# Patient Record
Sex: Female | Born: 1980 | Race: White | Hispanic: No | Marital: Married | Smoking: Former smoker
Health system: Southern US, Community
[De-identification: ages and names within clinical notes are randomized; demographics above are authoritative.]

## PROBLEM LIST (undated history)

## (undated) DIAGNOSIS — D509 Iron deficiency anemia, unspecified: Secondary | ICD-10-CM

## (undated) HISTORY — PX: TONSILLECTOMY: SUR1361

## (undated) HISTORY — DX: Iron deficiency anemia, unspecified: D50.9

## (undated) HISTORY — PX: DILATION AND CURETTAGE OF UTERUS: SHX78

---

## 2001-01-21 HISTORY — PX: LEEP: SHX91

## 2008-04-11 ENCOUNTER — Ambulatory Visit: Payer: Self-pay | Admitting: Interventional Radiology

## 2008-04-11 ENCOUNTER — Ambulatory Visit (HOSPITAL_BASED_OUTPATIENT_CLINIC_OR_DEPARTMENT_OTHER): Admission: RE | Admit: 2008-04-11 | Discharge: 2008-04-11 | Payer: Self-pay | Admitting: Family Medicine

## 2009-10-11 IMAGING — CT CT PELVIS WO/W CM
2 of 8 series · 13 of 46 positions shown, 18 images · IV contrast (agent unspecified)
Comparison: None

CT ABDOMEN

Addendum Begins

CT Pelvis IMPRESSION:
1. 2.8 cm left ovarian cyst.
2. Normal appendix.
3. Otherwise unremarkable CT pelvis.
Addendum Ends
CLINICAL DATA: Left flank pain, nausea
CT ABDOMEN AND PELVIS WITHOUT AND WITH CONTRAST
TECHNIQUE: Multidetector CT imaging of the abdomen and pelvis was
performed following the standard protocol before and following the
bolus administration of intravenous contrast.
Contrast:  100 ml [id number]-F99 IV

[Series 3: abd/pelvis w/o 3.0 coronal · coronal · non-contrast · 0.66mm/px · 3 of 69 slices shown]
[im 18/69  soft-tissue]
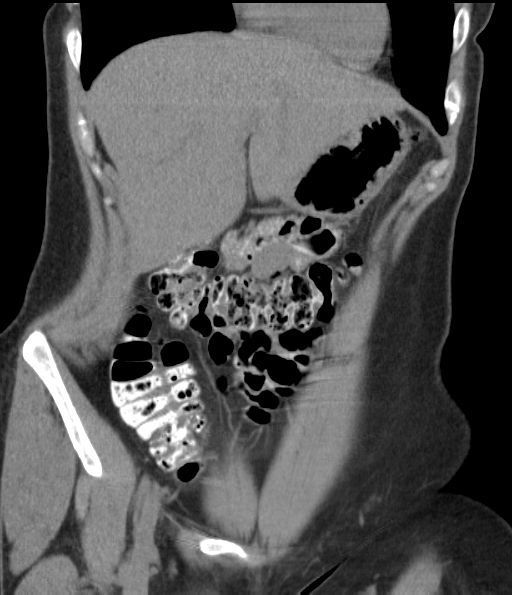
[im 35/69  soft-tissue]
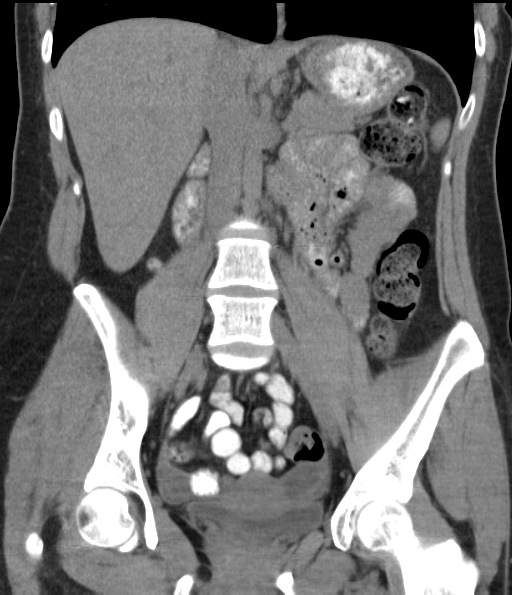
[im 52/69  soft-tissue]
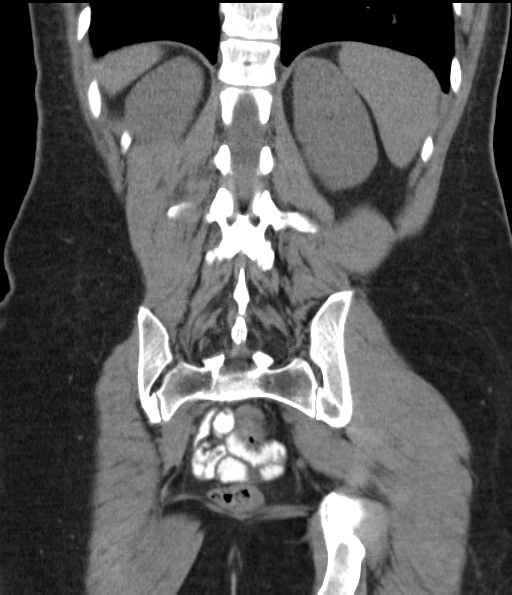

[Series 6: abd/pelvis w/ 5.0 b31f · axial · 0.70mm/px · z∈[-511,-161]mm · 10 of 85 slices shown, 15 images]
[im 8/85  soft-tissue]
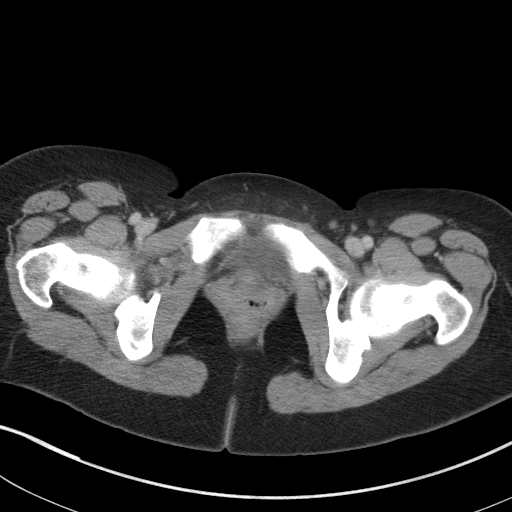
[im 8/85  bone]
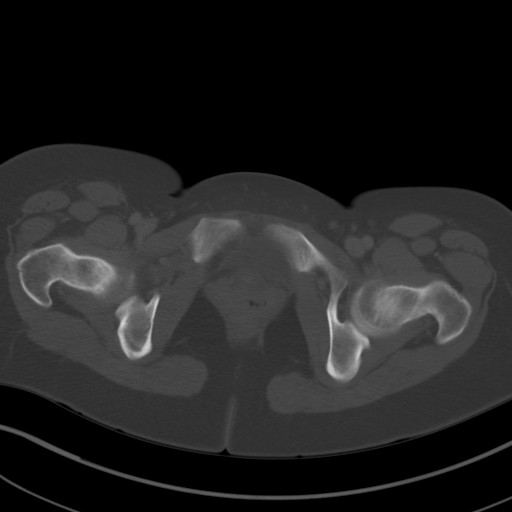
[im 15/85  soft-tissue]
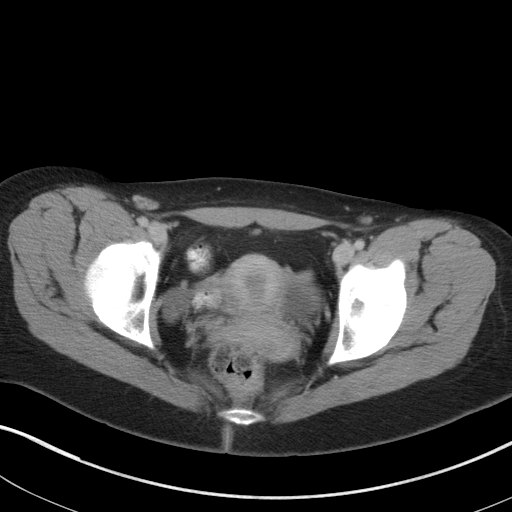
[im 29/85  soft-tissue]
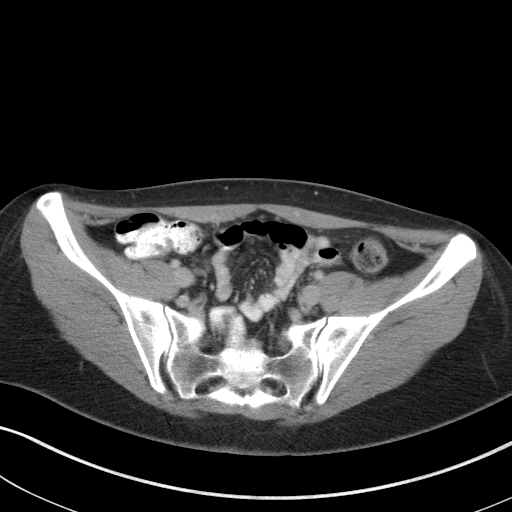
[im 36/85  soft-tissue]
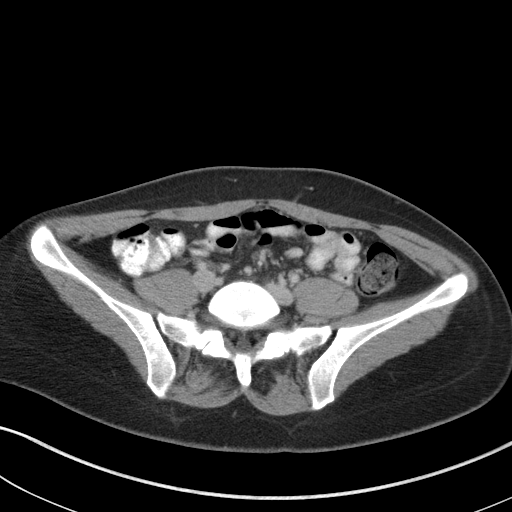
[im 43/85  soft-tissue]
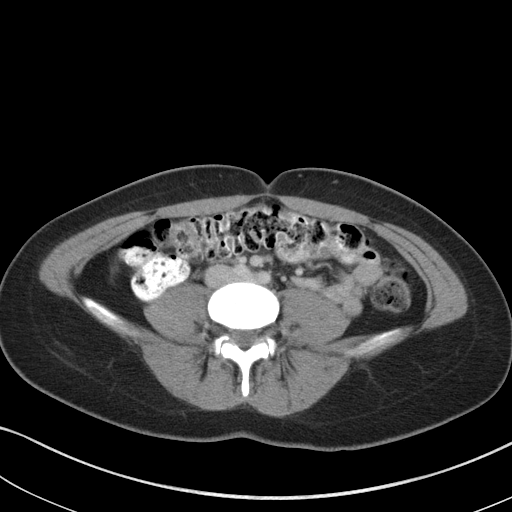
[im 50/85  soft-tissue]
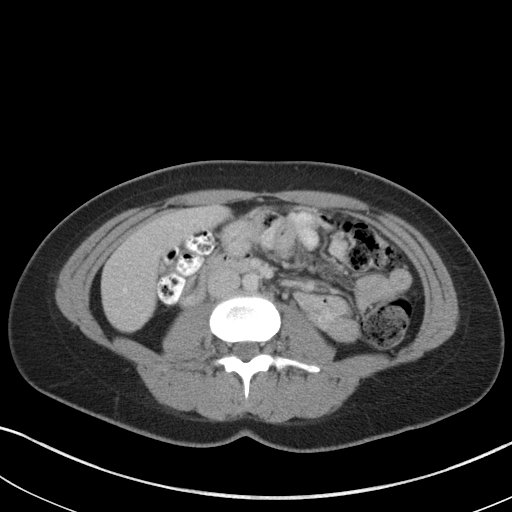
[im 57/85  soft-tissue]
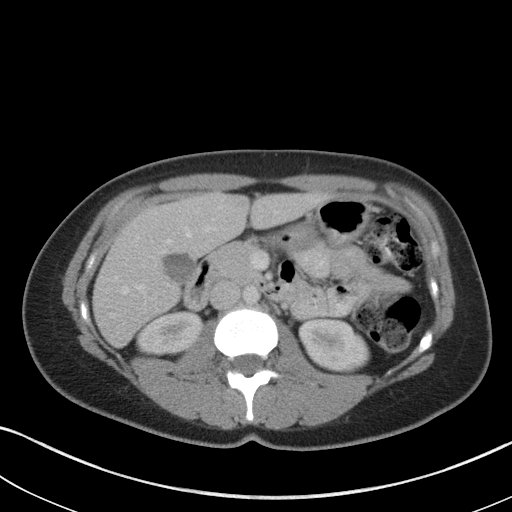
[im 57/85  lung]
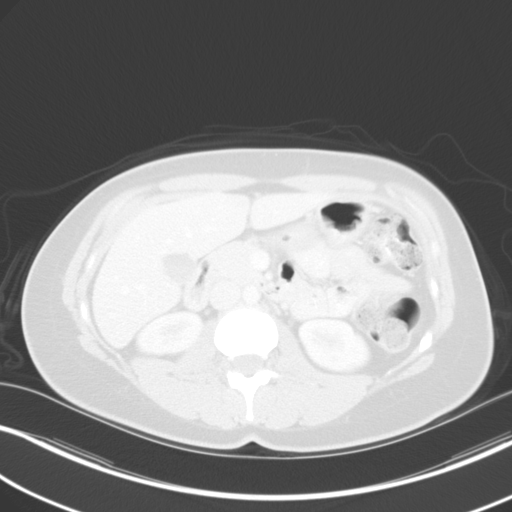
[im 64/85  lung]
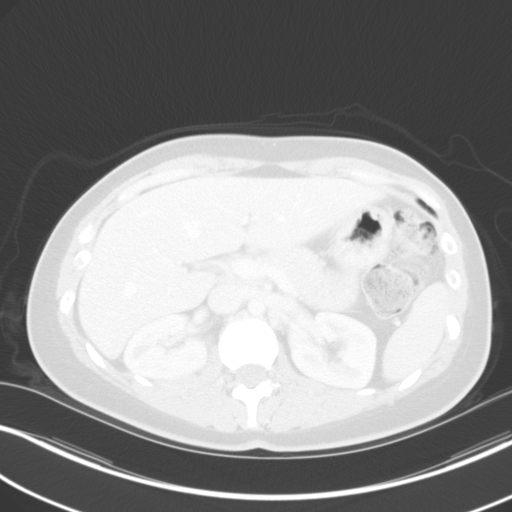
[im 71/85  soft-tissue]
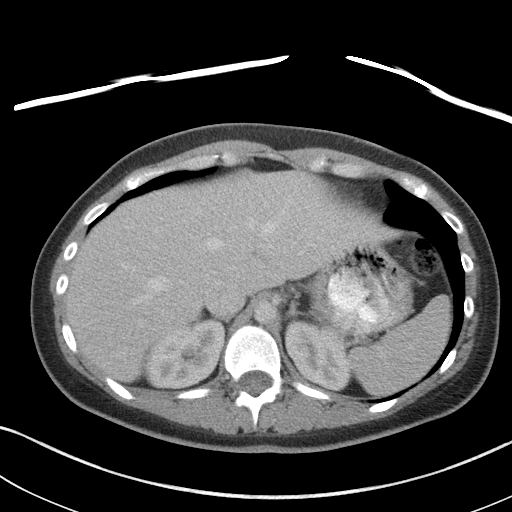
[im 71/85  lung]
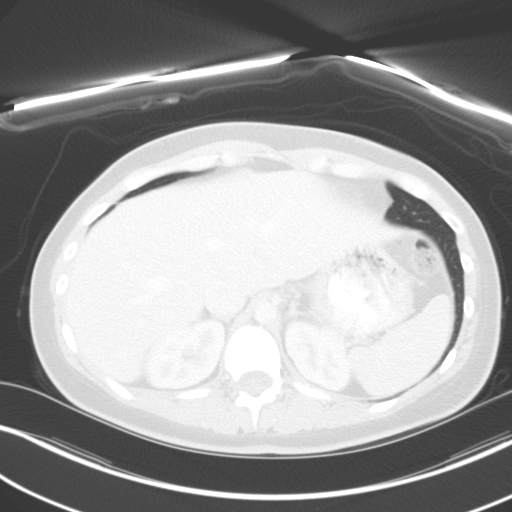
[im 78/85  soft-tissue]
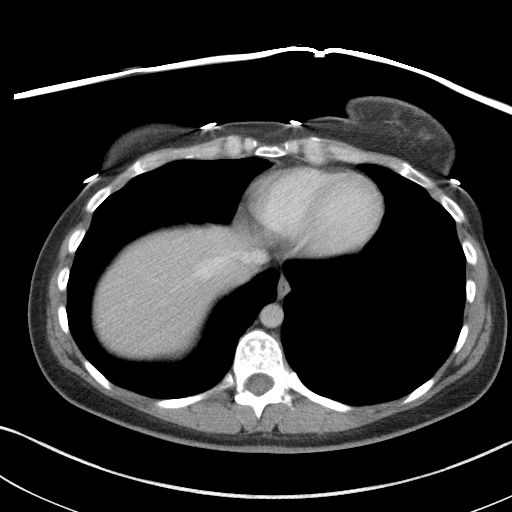
[im 78/85  lung]
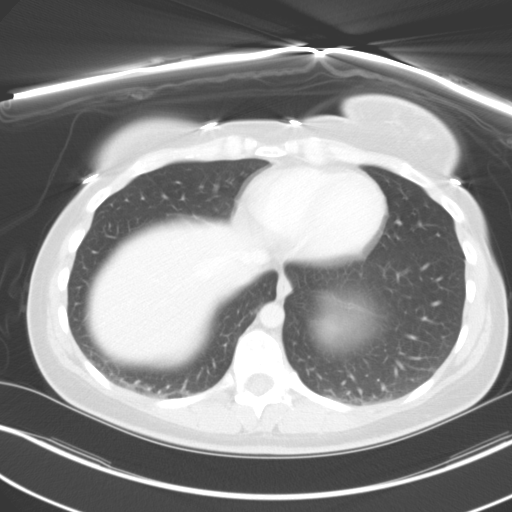
[im 78/85  bone]
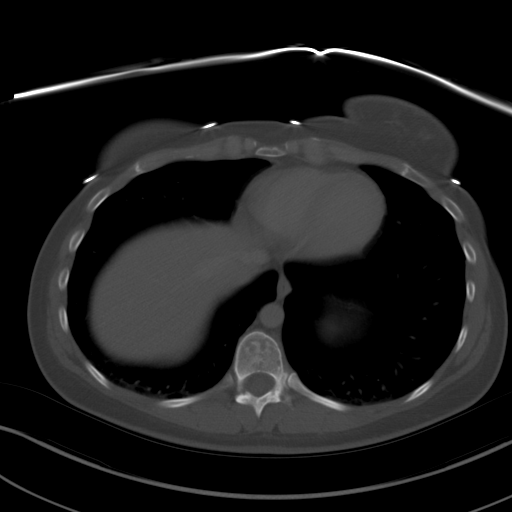

[13 of 46 positions shown; findings below may reference images not displayed]

FINDINGS: Minimal dependent atelectasis posteriorly in the
visualized lung bases.  The noncontrast scan shows no
nephrolithiasis or proximal ureteral calculus.  After IV contrast
administration, unremarkable appearance of the liver, gallbladder,
spleen, adrenal glands, kidneys, pancreas, abdominal aorta, small
bowel.  No free air.  No ascites.  No hydronephrosis.  No
mesenteric or retroperitoneal adenopathy.  Portal vein patent.
There is symmetric enhancement of  the renal parenchyma.
IMPRESSION: 1.  No acute abdominal abnormality.

CT PELVIS
FINDINGS: Normal appendix.  The colon is nondilated, unremarkable.
Urinary bladder incompletely distended. 2.8 cm left ovarian cyst.
Uterus and   right adnexal region unremarkable.  No free fluid.
Normal vascular enhancement.  No adenopathy.
IMPRESSION: 1.  2.8 cm left renal ovarian cyst.
2.  Normal appendix.
3.  Otherwise unremarkable CT pelvis.

## 2010-02-11 ENCOUNTER — Encounter: Payer: Self-pay | Admitting: Family Medicine

## 2013-04-08 LAB — OB RESULTS CONSOLE HIV ANTIBODY (ROUTINE TESTING): HIV: NONREACTIVE

## 2013-04-08 LAB — OB RESULTS CONSOLE RPR: RPR: NONREACTIVE

## 2013-04-08 LAB — OB RESULTS CONSOLE ABO/RH: RH Type: POSITIVE

## 2013-04-08 LAB — OB RESULTS CONSOLE HEPATITIS B SURFACE ANTIGEN: Hepatitis B Surface Ag: NEGATIVE

## 2013-04-08 LAB — OB RESULTS CONSOLE GC/CHLAMYDIA
CHLAMYDIA, DNA PROBE: NEGATIVE
GC PROBE AMP, GENITAL: NEGATIVE

## 2013-04-08 LAB — OB RESULTS CONSOLE RUBELLA ANTIBODY, IGM: Rubella: IMMUNE

## 2013-10-14 ENCOUNTER — Inpatient Hospital Stay (HOSPITAL_COMMUNITY): Payer: 59 | Admitting: Anesthesiology

## 2013-10-14 ENCOUNTER — Inpatient Hospital Stay (HOSPITAL_COMMUNITY)
Admission: AD | Admit: 2013-10-14 | Discharge: 2013-10-17 | DRG: 765 | Disposition: A | Discharge status: Home / Self Care | Payer: 59 | Source: Ambulatory Visit | Attending: Obstetrics and Gynecology | Admitting: Obstetrics and Gynecology

## 2013-10-14 ENCOUNTER — Encounter (HOSPITAL_COMMUNITY): Payer: Self-pay | Admitting: General Practice

## 2013-10-14 ENCOUNTER — Encounter (HOSPITAL_COMMUNITY): Payer: 59 | Admitting: Anesthesiology

## 2013-10-14 ENCOUNTER — Encounter (HOSPITAL_COMMUNITY)
Admission: AD | Disposition: A | Discharge status: Home / Self Care | Payer: Self-pay | Source: Ambulatory Visit | Attending: Obstetrics and Gynecology

## 2013-10-14 DIAGNOSIS — O139 Gestational [pregnancy-induced] hypertension without significant proteinuria, unspecified trimester: Secondary | ICD-10-CM | POA: Diagnosis present

## 2013-10-14 DIAGNOSIS — Z98891 History of uterine scar from previous surgery: Secondary | ICD-10-CM

## 2013-10-14 DIAGNOSIS — O1414 Severe pre-eclampsia complicating childbirth: Secondary | ICD-10-CM | POA: Diagnosis present

## 2013-10-14 DIAGNOSIS — Z87891 Personal history of nicotine dependence: Secondary | ICD-10-CM

## 2013-10-14 DIAGNOSIS — O4590 Premature separation of placenta, unspecified, unspecified trimester: Secondary | ICD-10-CM | POA: Diagnosis present

## 2013-10-14 DIAGNOSIS — O368390 Maternal care for abnormalities of the fetal heart rate or rhythm, unspecified trimester, not applicable or unspecified: Secondary | ICD-10-CM | POA: Diagnosis present

## 2013-10-14 DIAGNOSIS — O1423 HELLP syndrome (HELLP), third trimester: Secondary | ICD-10-CM

## 2013-10-14 DIAGNOSIS — O1420 HELLP syndrome (HELLP), unspecified trimester: Secondary | ICD-10-CM | POA: Diagnosis present

## 2013-10-14 LAB — CBC
HCT: 40.3 % (ref 36.0–46.0)
HEMOGLOBIN: 13.8 g/dL (ref 12.0–15.0)
MCH: 32 pg (ref 26.0–34.0)
MCHC: 34.2 g/dL (ref 30.0–36.0)
MCV: 93.5 fL (ref 78.0–100.0)
Platelets: 139 10*3/uL — ABNORMAL LOW (ref 150–400)
RBC: 4.31 MIL/uL (ref 3.87–5.11)
RDW: 13.2 % (ref 11.5–15.5)
WBC: 11.9 10*3/uL — ABNORMAL HIGH (ref 4.0–10.5)

## 2013-10-14 LAB — COMPREHENSIVE METABOLIC PANEL
ALBUMIN: 2.7 g/dL — AB (ref 3.5–5.2)
ALK PHOS: 209 U/L — AB (ref 39–117)
ALT: 147 U/L — AB (ref 0–35)
AST: 119 U/L — ABNORMAL HIGH (ref 0–37)
Anion gap: 17 — ABNORMAL HIGH (ref 5–15)
BUN: 10 mg/dL (ref 6–23)
CO2: 21 mEq/L (ref 19–32)
Calcium: 9.2 mg/dL (ref 8.4–10.5)
Chloride: 100 mEq/L (ref 96–112)
Creatinine, Ser: 0.7 mg/dL (ref 0.50–1.10)
GFR calc Af Amer: >90 mL/min (ref >90)
GFR calc non Af Amer: >90 mL/min (ref >90)
Glucose, Bld: 75 mg/dL (ref 70–99)
POTASSIUM: 4.4 meq/L (ref 3.7–5.3)
SODIUM: 138 meq/L (ref 137–147)
TOTAL PROTEIN: 6.7 g/dL (ref 6.0–8.3)
Total Bilirubin: 0.3 mg/dL (ref 0.3–1.2)

## 2013-10-14 LAB — TYPE AND SCREEN
ABO/RH(D): AB POS
Antibody Screen: NEGATIVE

## 2013-10-14 LAB — ABO/RH: ABO/RH(D): AB POS

## 2013-10-14 SURGERY — Surgical Case
Anesthesia: Spinal

## 2013-10-14 MED ORDER — MORPHINE SULFATE 0.5 MG/ML IJ SOLN
INTRAMUSCULAR | Status: AC
  Filled 2013-10-14: qty 10

## 2013-10-14 MED ORDER — DIPHENHYDRAMINE HCL 25 MG PO CAPS: 25.0000 mg | ORAL_CAPSULE | ORAL | Status: DC | PRN

## 2013-10-14 MED ORDER — SCOPOLAMINE 1 MG/3DAYS TD PT72
1.0000 | MEDICATED_PATCH | Freq: Once | TRANSDERMAL | Status: DC
Start: 2013-10-14 — End: 2013-10-17
  Administered 2013-10-14: 1.5 mg via TRANSDERMAL

## 2013-10-14 MED ORDER — PHENYLEPHRINE 8 MG IN D5W 100 ML (0.08MG/ML) PREMIX OPTIME
INJECTION | INTRAVENOUS | Status: AC
  Filled 2013-10-14: qty 100

## 2013-10-14 MED ORDER — FENTANYL CITRATE 0.05 MG/ML IJ SOLN
INTRAMUSCULAR | Status: AC
  Filled 2013-10-14: qty 2

## 2013-10-14 MED ORDER — NALOXONE HCL 1 MG/ML IJ SOLN
1.0000 ug/kg/h | INTRAVENOUS | Status: DC | PRN
  Filled 2013-10-14: qty 2

## 2013-10-14 MED ORDER — OXYTOCIN 40 UNITS IN LACTATED RINGERS INFUSION - SIMPLE MED
INTRAVENOUS | Status: DC | PRN
  Administered 2013-10-14: 40 [IU] via INTRAVENOUS

## 2013-10-14 MED ORDER — CITRIC ACID-SODIUM CITRATE 334-500 MG/5ML PO SOLN
30.0000 mL | Freq: Once | ORAL | Status: AC
  Administered 2013-10-14: 30 mL via ORAL

## 2013-10-14 MED ORDER — NALOXONE HCL 0.4 MG/ML IJ SOLN
0.4000 mg | INTRAMUSCULAR | Status: DC | PRN
Start: 2013-10-14 — End: 2013-10-17

## 2013-10-14 MED ORDER — MAGNESIUM SULFATE 40 G IN LACTATED RINGERS - SIMPLE
2.0000 g/h | INTRAVENOUS | Status: DC
  Administered 2013-10-14: 2 g/h via INTRAVENOUS
  Filled 2013-10-14: qty 500

## 2013-10-14 MED ORDER — WITCH HAZEL-GLYCERIN EX PADS: 1.0000 "application " | MEDICATED_PAD | CUTANEOUS | Status: DC | PRN

## 2013-10-14 MED ORDER — HYDROMORPHONE HCL 1 MG/ML IJ SOLN: 0.2500 mg | INTRAMUSCULAR | Status: DC | PRN

## 2013-10-14 MED ORDER — DIPHENHYDRAMINE HCL 50 MG/ML IJ SOLN: 12.5000 mg | INTRAMUSCULAR | Status: DC | PRN

## 2013-10-14 MED ORDER — LANOLIN HYDROUS EX OINT: 1.0000 "application " | TOPICAL_OINTMENT | CUTANEOUS | Status: DC | PRN

## 2013-10-14 MED ORDER — MEPERIDINE HCL 25 MG/ML IJ SOLN: 6.2500 mg | INTRAMUSCULAR | Status: DC | PRN

## 2013-10-14 MED ORDER — ZOLPIDEM TARTRATE 5 MG PO TABS: 5.0000 mg | ORAL_TABLET | Freq: Every evening | ORAL | Status: DC | PRN

## 2013-10-14 MED ORDER — DIPHENHYDRAMINE HCL 25 MG PO CAPS: 25.0000 mg | ORAL_CAPSULE | Freq: Four times a day (QID) | ORAL | Status: DC | PRN

## 2013-10-14 MED ORDER — CITRIC ACID-SODIUM CITRATE 334-500 MG/5ML PO SOLN
ORAL | Status: AC
  Filled 2013-10-14: qty 15

## 2013-10-14 MED ORDER — SIMETHICONE 80 MG PO CHEW
80.0000 mg | CHEWABLE_TABLET | Freq: Three times a day (TID) | ORAL | Status: DC
  Administered 2013-10-15 – 2013-10-17 (×6): 80 mg via ORAL
  Filled 2013-10-14 (×6): qty 1

## 2013-10-14 MED ORDER — NALBUPHINE HCL 10 MG/ML IJ SOLN: 5.0000 mg | INTRAMUSCULAR | Status: DC | PRN

## 2013-10-14 MED ORDER — SCOPOLAMINE 1 MG/3DAYS TD PT72
MEDICATED_PATCH | TRANSDERMAL | Status: AC
  Filled 2013-10-14: qty 1

## 2013-10-14 MED ORDER — OXYCODONE-ACETAMINOPHEN 5-325 MG PO TABS
1.0000 | ORAL_TABLET | ORAL | Status: DC | PRN
  Administered 2013-10-15: 1 via ORAL
  Filled 2013-10-14 (×2): qty 1

## 2013-10-14 MED ORDER — NALBUPHINE HCL 10 MG/ML IJ SOLN: 5.0000 mg | Freq: Once | INTRAMUSCULAR | Status: AC | PRN

## 2013-10-14 MED ORDER — MEPERIDINE HCL 25 MG/ML IJ SOLN
INTRAMUSCULAR | Status: AC
  Filled 2013-10-14: qty 1

## 2013-10-14 MED ORDER — OXYTOCIN 40 UNITS IN LACTATED RINGERS INFUSION - SIMPLE MED: 62.5000 mL/h | INTRAVENOUS | Status: AC

## 2013-10-14 MED ORDER — IBUPROFEN 600 MG PO TABS
600.0000 mg | ORAL_TABLET | Freq: Four times a day (QID) | ORAL | Status: DC
  Administered 2013-10-15 – 2013-10-17 (×9): 600 mg via ORAL
  Filled 2013-10-14 (×10): qty 1

## 2013-10-14 MED ORDER — KETOROLAC TROMETHAMINE 30 MG/ML IJ SOLN
INTRAMUSCULAR | Status: AC
  Filled 2013-10-14: qty 1

## 2013-10-14 MED ORDER — PRENATAL MULTIVITAMIN CH
1.0000 | ORAL_TABLET | Freq: Every day | ORAL | Status: DC
  Administered 2013-10-15 – 2013-10-17 (×3): 1 via ORAL
  Filled 2013-10-14 (×3): qty 1

## 2013-10-14 MED ORDER — ONDANSETRON HCL 4 MG/2ML IJ SOLN: 4.0000 mg | INTRAMUSCULAR | Status: DC | PRN

## 2013-10-14 MED ORDER — SIMETHICONE 80 MG PO CHEW
80.0000 mg | CHEWABLE_TABLET | ORAL | Status: DC | PRN
  Administered 2013-10-17: 80 mg via ORAL

## 2013-10-14 MED ORDER — OXYCODONE-ACETAMINOPHEN 5-325 MG PO TABS: 2.0000 | ORAL_TABLET | ORAL | Status: DC | PRN

## 2013-10-14 MED ORDER — MAGNESIUM SULFATE BOLUS VIA INFUSION
4.0000 g | Freq: Once | INTRAVENOUS | Status: AC
  Administered 2013-10-14: 4 g via INTRAVENOUS
  Filled 2013-10-14: qty 500

## 2013-10-14 MED ORDER — MORPHINE SULFATE (PF) 0.5 MG/ML IJ SOLN
INTRAMUSCULAR | Status: DC | PRN
  Administered 2013-10-14: .1 mg via INTRATHECAL

## 2013-10-14 MED ORDER — TETANUS-DIPHTH-ACELL PERTUSSIS 5-2.5-18.5 LF-MCG/0.5 IM SUSP
0.5000 mL | Freq: Once | INTRAMUSCULAR | Status: DC
  Filled 2013-10-14: qty 0.5

## 2013-10-14 MED ORDER — LACTATED RINGERS IV SOLN
INTRAVENOUS | Status: DC
  Administered 2013-10-14: 18:00:00 via INTRAVENOUS

## 2013-10-14 MED ORDER — KETOROLAC TROMETHAMINE 30 MG/ML IJ SOLN: 30.0000 mg | Freq: Four times a day (QID) | INTRAMUSCULAR | Status: DC | PRN

## 2013-10-14 MED ORDER — SIMETHICONE 80 MG PO CHEW
80.0000 mg | CHEWABLE_TABLET | ORAL | Status: DC
  Administered 2013-10-15 (×2): 80 mg via ORAL
  Filled 2013-10-14 (×3): qty 1

## 2013-10-14 MED ORDER — ONDANSETRON HCL 4 MG/2ML IJ SOLN: 4.0000 mg | Freq: Three times a day (TID) | INTRAMUSCULAR | Status: DC | PRN

## 2013-10-14 MED ORDER — CEFAZOLIN SODIUM-DEXTROSE 2-3 GM-% IV SOLR
2.0000 g | INTRAVENOUS | Status: AC
Start: 2013-10-14 — End: 2013-10-14
  Administered 2013-10-14: 2 g via INTRAVENOUS
  Filled 2013-10-14: qty 50

## 2013-10-14 MED ORDER — LACTATED RINGERS IV SOLN
INTRAVENOUS | Status: DC | PRN
  Administered 2013-10-14: 19:00:00 via INTRAVENOUS

## 2013-10-14 MED ORDER — FAMOTIDINE IN NACL 20-0.9 MG/50ML-% IV SOLN
20.0000 mg | Freq: Once | INTRAVENOUS | Status: AC
  Administered 2013-10-14: 20 mg via INTRAVENOUS
  Filled 2013-10-14: qty 50

## 2013-10-14 MED ORDER — LACTATED RINGERS IV SOLN
INTRAVENOUS | Status: DC
  Administered 2013-10-15: 07:00:00 via INTRAVENOUS

## 2013-10-14 MED ORDER — MEPERIDINE HCL 25 MG/ML IJ SOLN
INTRAMUSCULAR | Status: DC | PRN
  Administered 2013-10-14: 25 mg via INTRAVENOUS

## 2013-10-14 MED ORDER — ONDANSETRON HCL 4 MG/2ML IJ SOLN
INTRAMUSCULAR | Status: AC
  Filled 2013-10-14: qty 2

## 2013-10-14 MED ORDER — DIBUCAINE 1 % RE OINT: 1.0000 "application " | TOPICAL_OINTMENT | RECTAL | Status: DC | PRN

## 2013-10-14 MED ORDER — ONDANSETRON HCL 4 MG PO TABS: 4.0000 mg | ORAL_TABLET | ORAL | Status: DC | PRN

## 2013-10-14 MED ORDER — ONDANSETRON HCL 4 MG/2ML IJ SOLN
INTRAMUSCULAR | Status: DC | PRN
  Administered 2013-10-14: 4 mg via INTRAVENOUS

## 2013-10-14 MED ORDER — OXYTOCIN 10 UNIT/ML IJ SOLN
INTRAMUSCULAR | Status: AC
  Filled 2013-10-14: qty 4

## 2013-10-14 MED ORDER — SENNOSIDES-DOCUSATE SODIUM 8.6-50 MG PO TABS
2.0000 | ORAL_TABLET | ORAL | Status: DC
  Administered 2013-10-15 – 2013-10-17 (×3): 2 via ORAL
  Filled 2013-10-14 (×3): qty 2

## 2013-10-14 MED ORDER — MENTHOL 3 MG MT LOZG: 1.0000 | LOZENGE | OROMUCOSAL | Status: DC | PRN

## 2013-10-14 MED ORDER — SODIUM CHLORIDE 0.9 % IJ SOLN: 3.0000 mL | INTRAMUSCULAR | Status: DC | PRN

## 2013-10-14 MED ORDER — KETOROLAC TROMETHAMINE 30 MG/ML IJ SOLN
30.0000 mg | Freq: Four times a day (QID) | INTRAMUSCULAR | Status: DC | PRN
  Administered 2013-10-14: 30 mg via INTRAVENOUS

## 2013-10-14 MED ORDER — PHENYLEPHRINE 8 MG IN D5W 100 ML (0.08MG/ML) PREMIX OPTIME
INJECTION | INTRAVENOUS | Status: DC | PRN
  Administered 2013-10-14: 60 ug/min via INTRAVENOUS

## 2013-10-14 SURGICAL SUPPLY — 35 items
BENZOIN TINCTURE PRP APPL 2/3 (GAUZE/BANDAGES/DRESSINGS) ×3 IMPLANT
CLAMP CORD UMBIL (MISCELLANEOUS) IMPLANT
CLOSURE WOUND 1/2 X4 (GAUZE/BANDAGES/DRESSINGS) ×1
CLOTH BEACON ORANGE TIMEOUT ST (SAFETY) ×3 IMPLANT
COVER LIGHT HANDLE  1/PK (MISCELLANEOUS) ×4
COVER LIGHT HANDLE 1/PK (MISCELLANEOUS) ×2 IMPLANT
DRAPE SHEET LG 3/4 BI-LAMINATE (DRAPES) IMPLANT
DRSG OPSITE POSTOP 4X10 (GAUZE/BANDAGES/DRESSINGS) ×3 IMPLANT
DURAPREP 26ML APPLICATOR (WOUND CARE) ×3 IMPLANT
ELECT REM PT RETURN 9FT ADLT (ELECTROSURGICAL) ×3
ELECTRODE REM PT RTRN 9FT ADLT (ELECTROSURGICAL) ×1 IMPLANT
EXTRACTOR VACUUM KIWI (MISCELLANEOUS) IMPLANT
GLOVE BIO SURGEON STRL SZ 6.5 (GLOVE) ×2 IMPLANT
GLOVE BIO SURGEONS STRL SZ 6.5 (GLOVE) ×1
GOWN STRL REUS W/TWL LRG LVL3 (GOWN DISPOSABLE) ×6 IMPLANT
KIT ABG SYR 3ML LUER SLIP (SYRINGE) IMPLANT
NEEDLE HYPO 25X5/8 SAFETYGLIDE (NEEDLE) IMPLANT
NS IRRIG 1000ML POUR BTL (IV SOLUTION) ×3 IMPLANT
PACK C SECTION WH (CUSTOM PROCEDURE TRAY) ×3 IMPLANT
PAD OB MATERNITY 4.3X12.25 (PERSONAL CARE ITEMS) ×3 IMPLANT
RTRCTR C-SECT PINK 25CM LRG (MISCELLANEOUS) ×3 IMPLANT
STRIP CLOSURE SKIN 1/2X4 (GAUZE/BANDAGES/DRESSINGS) ×2 IMPLANT
SUT CHROMIC 1 CTX 36 (SUTURE) ×6 IMPLANT
SUT PLAIN 0 NONE (SUTURE) IMPLANT
SUT PLAIN 2 0 XLH (SUTURE) ×3 IMPLANT
SUT VIC AB 0 CT1 27 (SUTURE) ×4
SUT VIC AB 0 CT1 27XBRD ANBCTR (SUTURE) ×2 IMPLANT
SUT VIC AB 2-0 CT1 27 (SUTURE) ×2
SUT VIC AB 2-0 CT1 TAPERPNT 27 (SUTURE) ×1 IMPLANT
SUT VIC AB 3-0 CT1 27 (SUTURE)
SUT VIC AB 3-0 CT1 TAPERPNT 27 (SUTURE) IMPLANT
SUT VIC AB 4-0 KS 27 (SUTURE) ×3 IMPLANT
TOWEL OR 17X24 6PK STRL BLUE (TOWEL DISPOSABLE) ×3 IMPLANT
TRAY FOLEY CATH 14FR (SET/KITS/TRAYS/PACK) ×3 IMPLANT
WATER STERILE IRR 1000ML POUR (IV SOLUTION) ×3 IMPLANT

## 2013-10-14 NOTE — Anesthesia Preprocedure Evaluation (Addendum)
Anesthesia Evaluation  Patient identified by MRN, date of birth, ID band Patient awake    Reviewed: Allergy & Precautions, H&P , Patient's Chart, lab work & pertinent test results  Airway Mallampati: III TM Distance: >3 FB Neck ROM: full    Dental no notable dental hx.    Pulmonary former smoker,  breath sounds clear to auscultation  Pulmonary exam normal       Cardiovascular Exercise Tolerance: Good hypertension, Rhythm:regular Rate:Normal     Neuro/Psych    GI/Hepatic   Endo/Other    Renal/GU      Musculoskeletal   Abdominal   Peds  Hematology   Anesthesia Other Findings   Reproductive/Obstetrics                          Anesthesia Physical Anesthesia Plan  ASA: III and emergent  Anesthesia Plan: Spinal   Post-op Pain Management:    Induction:   Airway Management Planned:   Additional Equipment:   Intra-op Plan:   Post-operative Plan:   Informed Consent: I have reviewed the patients History and Physical, chart, labs and discussed the procedure including the risks, benefits and alternatives for the proposed anesthesia with the patient or authorized representative who has indicated his/her understanding and acceptance.   Dental Advisory Given  Plan Discussed with: CRNA  Anesthesia Plan Comments: (Lab work confirmed with CRNA in room. Platelets okay. Discussed spinal anesthetic, and patient consents to the procedure:  included risk of possible headache,backache, failed block, allergic reaction, and nerve injury. This patient was asked if she had any questions or concerns before the procedure started. )        Anesthesia Quick Evaluation

## 2013-10-14 NOTE — Anesthesia Postprocedure Evaluation (Signed)
Anesthesia Post Note  Patient: Tasha Price  Procedure(s) Performed: Procedure(s) (LRB): CESAREAN SECTION (N/A)  Anesthesia type: Spinal  Patient location: PACU  Post pain: Pain level controlled  Post assessment: Post-op Vital signs reviewed  Last Vitals:  Filed Vitals:   10/14/13 1817  BP: 167/85  Pulse: 111  Temp:   Resp:     Post vital signs: Reviewed  Level of consciousness: awake  Complications: No apparent anesthesia complications

## 2013-10-14 NOTE — Transfer of Care (Signed)
Immediate Anesthesia Transfer of Care Note  Patient: Tasha Price  Procedure(s) Performed: Procedure(s): CESAREAN SECTION (N/A)  Patient Location: PACU  Anesthesia Type:Spinal  Level of Consciousness: awake, alert  and oriented  Airway & Oxygen Therapy: Patient Spontanous Breathing  Post-op Assessment: Report given to PACU RN and Post -op Vital signs reviewed and stable  Post vital signs: Reviewed and stable  Complications: No apparent anesthesia complications

## 2013-10-14 NOTE — Progress Notes (Signed)
Patient ID: Tasha Price, female   DOB: February 24, 1980, 33 y.o.   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

## 2013-10-14 NOTE — Op Note (Signed)
Operative report  Preoperative diagnosis Term pregnancy at 37-4/7 weeks Preeclampsia with HELLP syndrome Fetal heart rate decelerations  Postoperative diagnosis Same with probable partial placental abruption  Procedure Primary low transverse C-section with 2 layer closure of uterus  Surgeon Dr. Huel Cote  Anesthesia Spinal  Fluids Estimated blood loss 800 cc Urine output 150 cc clear urine IV fluids 2200 cc LR  Findings There was a viable female infant in the vertex presentation. Apgars were 8 and 9. Weight pending at time of dictation.  Cord pH 7.24. The placenta had a small amount of adherent clot consistent with a partial abruption and was sent to pathology. Uterus ovaries and tubes appeared normal.  Specimen Placenta sent to pathology  Procedure note  Patient was taken to the operating room where spinal anesthesia was quickly obtained and found to be adequate by Allis clamp test. The fetal heart rate was then reassessed and stable at 150. She was prepped and draped in the normal sterile fashion in the dorsal supine position with a leftward tilt. An appropriate time out was performed. A Pfannenstiel skin incision was then made with the scalpel and carried through to the underlying layer of fascia by sharp dissection and Bovie cautery. The fascia was nicked in the midline and the incision was extended laterally with Mayo scissors. The superior aspect was dissected off the rectus muscles. Rectus muscles were separated in the midline and the peritoneal cavity entered bluntly. The peritoneal incision was then extended both superiorly and inferiorly with careful attention to avoid both bowel and bladder. The Alexis self-retaining wound retractor was then placed within the incision and the lower uterine segment exposed. The bladder flap was developed with Metzenbaum scissors and pushed away from the lower uterine segment. The lower uterine segment was then incised in a transverse  fashion and the cavity itself entered bluntly. There were several small blood clots encountered and the cavity was entered. The incision was extended bluntly. The infant's head was then lifted and delivered from the incision without difficulty. The remainder of the infant delivered and the nose and mouth bulb suctioned with the cord clamped and cut as well. The infant was handed off to the waiting pediatricians. The placenta was then spontaneously expressed from the uterus and the uterus cleared of all clots and debris with moist lap sponge. The uterine incision was then repaired in 2 layers the first layer was a running locked layer 1-0 chromic and the second an imbricating layer of the same suture. The tubes and ovaries were inspected and the gutters cleared of all clots and debris. The uterine incision was inspected and found to be hemostatic. All instruments and sponges as well as the Alexis retractor were then removed from the abdomen. The rectus muscles and peritoneum were then reapproximated with several interrupted mattress sutures of 2-0 Vicryl. The fascia was then closed with 0 Vicryl in a running fashion. Subcutaneous tissue was reapproximated with 3-0 plain in a running fashion. The skin was closed with a subcuticular stitch of 4-0 Vicryl on a Keith needle and then reinforced with benzoin and Steri-Strips. At the conclusion of the procedure all instruments and sponge counts were correct. Patient was taken to the recovery room in good condition with her baby accompanying her skin to skin.

## 2013-10-14 NOTE — Anesthesia Procedure Notes (Signed)
Spinal  Patient location during procedure: OR Preanesthetic Checklist Completed: patient identified, site marked, surgical consent, pre-op evaluation, timeout performed, IV checked, risks and benefits discussed and monitors and equipment checked Spinal Block Patient position: sitting Prep: DuraPrep Patient monitoring: heart rate, cardiac monitor, continuous pulse ox and blood pressure Approach: midline Location: L3-4 Injection technique: single-shot Needle Needle type: Sprotte  Needle gauge: 24 G Needle length: 9 cm Assessment Sensory level: T4 Additional Notes Spinal Dosage in OR  Bupivicaine ml       1.9 PFMS04   mcg        100    

## 2013-10-14 NOTE — H&P (Signed)
Tasha Price is a 33 y.o. female G2P0010 at 58 4/7 weeks (EDD 10/31/13 by LMP c/w 9 week [place]) presenting from office for decelerations noted on NST. Prenatal care relatively uncomplicated until about 2 weeks ago when BP noted to be increasing to 140/90.  No proteinuria, labs WNL and NST reactive last visit.  Returned today for f/u and BP slightly higher at 140/100.  No headaches or proteinuriabut some mild epigastric pain and reports good FM.  Pt was placed on the monitor and promptly had 2 spontaneous decelerations of the fetal heart rate, so sent immediately to MAU.  No pain, no VB.  Mild contractions.  Maternal Medical History:  Contractions: Frequency: regular.   Perceived severity is mild.    Fetal activity: Perceived fetal activity is normal.   Last perceived fetal movement was within the past hour.    Prenatal complications: PIH.   Prenatal Complications - Diabetes: none.    OB History   Grav Para Term Preterm Abortions TAB SAB Ect Mult Living   1              History reviewed. No pertinent past medical history. Past Surgical History  Procedure Laterality Date  . Dilation and curettage of uterus    . Tonsillectomy    . Leep  2003   Family History: family history is not on file. Social History:  reports that she quit smoking about 3 years ago. She has never used smokeless tobacco. She reports that she does not drink alcohol or use illicit drugs.   Prenatal Transfer Tool  Maternal Diabetes: No Genetic Screening: Normal Maternal Ultrasounds/Referrals: Normal Fetal Ultrasounds or other Referrals:  None Maternal Substance Abuse:  No Significant Maternal Medications:  None Significant Maternal Lab Results:  None Other Comments:  None  Review of Systems  Gastrointestinal: Positive for abdominal pain.  Neurological: Negative for headaches.    Dilation: 3 Effacement (%): 70 Exam by:: Dr. Senaida Ores Blood pressure 154/97, pulse 94, temperature 98.3 F (36.8 C), temperature  source Oral, resp. rate 18, height  (1.753 m), weight 104.327 kg (230 lb), last menstrual period 01/24/2013. Maternal Exam:  Uterine Assessment: Contraction strength is mild.  Contraction frequency is regular.   Abdomen: Patient reports no abdominal tenderness. Fetal presentation: vertex  Introitus: Normal vulva. Normal vagina.    Fetal Exam Fetal Monitor Review: Variability: moderate (6-25 bpm).   Pattern: variable decelerations.    Fetal State Assessment: Category II - tracings are indeterminate.     Physical Exam  Constitutional: She appears well-developed and well-nourished.  Cardiovascular: Normal rate.   Respiratory: Effort normal.  Genitourinary: Vagina normal and uterus normal.  Neurological: She is alert.  Psychiatric: She has a normal mood and affect.    Prenatal labs: ABO, Rh:  AB postiive Antibody:  Negative Rubella:  Immune RPR:   Neg HBsAg:   Neg HIV:   Neg GBS:   Neg First trimester screen normal AFP normal One hour GTT   Assessment/Plan: PT assessed in MAU and FHR continues to have prolonged spontaneous decelerations with mild contractions only.  D/w pt that the baby needs to be delivered and after observing the tracing the baby will not tolerate labor and needs to be delivered expediently.  Risks and benefits of c-section reviewed including bleeding, infection, and possible damage to bowel and bladder.  Pt understands and is ready to proceed.  OR and anesthesia notified and opening room.  Awaiting labs   [name] W 10/14/2013, 6:04 PM

## 2013-10-14 NOTE — MAU Provider Note (Signed)
  History     CSN: [id number]  Arrival date and time: 10/14/13 1637   First Provider Initiated Contact with Patient 10/14/13 1723      Chief Complaint  Patient presents with  . decels at office    HPI  Pt is a 33 yo G1P0 at [redacted]w[redacted]d wks IUP sent from office due to fetal heart rate decelerations and elevated blood pressure.  Pt denies headache and vision change.  Reports RUQ pain.  Reports feeling contractions, however not painful.  States 2-3 cm in office.    History reviewed. No pertinent past medical history.  Past Surgical History  Procedure Laterality Date  . Dilation and curettage of uterus    . Tonsillectomy    . Leep  2003    History reviewed. No pertinent family history.  History  Substance Use Topics  . Smoking status: Former Smoker    Quit date: 10/15/2010  . Smokeless tobacco: Never Used  . Alcohol Use: No    Allergies: No Known Allergies  Prescriptions prior to admission  Medication Sig Dispense Refill  . calcium carbonate (TUMS - DOSED IN MG ELEMENTAL CALCIUM) 500 MG chewable tablet Chew 1 tablet by mouth 3 (three) times daily as needed for indigestion or heartburn.      . Ferrous Sulfate (SLOW FE PO) Take 1 tablet by mouth daily.      . Prenatal Vit-Fe Fumarate-FA (PRENATAL MULTIVITAMIN) TABS tablet Take 1 tablet by mouth daily at 12 noon.        Review of Systems  Eyes: Negative for blurred vision and double vision.  Gastrointestinal: Positive for abdominal pain (RUQ).  Neurological: Negative for headaches.  All other systems reviewed and are negative.  Physical Exam   Blood pressure 154/97, pulse 94, temperature 98.3 F (36.8 C), temperature source Oral, resp. rate 18, height  (1.753 m), weight 104.327 kg (230 lb), last menstrual period 01/24/2013. Filed Vitals:   10/14/13 1649 10/14/13 1658 10/14/13 1702 10/14/13 1717  BP: 162/90 170/99 153/89 154/97  Pulse: 96 98 99 94  Temp:      TempSrc:      Resp:      Height:      Weight:         Physical Exam  Constitutional: She is oriented to person, place, and time. She appears well-developed and well-nourished. No distress.  HENT:  Head: Normocephalic.  Eyes: Pupils are equal, round, and reactive to light.  Neck: Normal range of motion. Neck supple.  Cardiovascular: Normal rate, regular rhythm and normal heart sounds.   Respiratory: Effort normal and breath sounds normal. No respiratory distress.  GI: Soft. There is no tenderness.  Genitourinary: No bleeding around the vagina. Vaginal discharge:    Musculoskeletal: Normal range of motion. She exhibits edema (bilat 1+ pedal edema).  Neurological: She is alert and oriented to person, place, and time. She has normal reflexes. She displays normal reflexes.  Skin: Skin is warm and dry.   FHR 150's with intermittent prolonged decels (approx 2 min) with return to baseline; +accels Toco - 2-5  1740 Consulted with Dr. Senaida Ores > reviewed HPI/exam/feta tracing/blood pressures > reviewed fetal tracing remotely > will be in to assess the patient  1755 Dr. Senaida Ores here to assess patient and assumes care.  Eino Farber Kennith Gain, CNM  MAU Course  Procedures   Assessment and Plan

## 2013-10-14 NOTE — MAU Note (Signed)
Sent from office for further evaluation. Was having decels at the office. Pt brought directly to rm on arrival

## 2013-10-15 ENCOUNTER — Telehealth (HOSPITAL_COMMUNITY): Payer: Self-pay | Admitting: *Deleted

## 2013-10-15 ENCOUNTER — Encounter (HOSPITAL_COMMUNITY): Payer: Self-pay | Admitting: Obstetrics and Gynecology

## 2013-10-15 LAB — COMPREHENSIVE METABOLIC PANEL
ALBUMIN: 2.2 g/dL — AB (ref 3.5–5.2)
ALT: 123 U/L — ABNORMAL HIGH (ref 0–35)
ALT: 111 U/L — ABNORMAL HIGH (ref 0–35)
ANION GAP: 11 (ref 5–15)
ANION GAP: 10 (ref 5–15)
AST: 114 U/L — ABNORMAL HIGH (ref 0–37)
AST: 86 U/L — ABNORMAL HIGH (ref 0–37)
Albumin: 2.2 g/dL — ABNORMAL LOW (ref 3.5–5.2)
Alkaline Phosphatase: 173 U/L — ABNORMAL HIGH (ref 39–117)
Alkaline Phosphatase: 161 U/L — ABNORMAL HIGH (ref 39–117)
BUN: 9 mg/dL (ref 6–23)
BUN: 10 mg/dL (ref 6–23)
CO2: 24 meq/L (ref 19–32)
CO2: 26 mEq/L (ref 19–32)
CREATININE: 0.79 mg/dL (ref 0.50–1.10)
CREATININE: 0.82 mg/dL (ref 0.50–1.10)
Calcium: 7.7 mg/dL — ABNORMAL LOW (ref 8.4–10.5)
Calcium: 7.5 mg/dL — ABNORMAL LOW (ref 8.4–10.5)
Chloride: 99 mEq/L (ref 96–112)
Chloride: 99 mEq/L (ref 96–112)
GFR calc Af Amer: >90 mL/min (ref >90)
GFR calc non Af Amer: >90 mL/min (ref >90)
GLUCOSE: 77 mg/dL (ref 70–99)
Glucose, Bld: 93 mg/dL (ref 70–99)
POTASSIUM: 4.3 meq/L (ref 3.7–5.3)
Potassium: 4.9 mEq/L (ref 3.7–5.3)
Sodium: 134 mEq/L — ABNORMAL LOW (ref 137–147)
Sodium: 135 mEq/L — ABNORMAL LOW (ref 137–147)
TOTAL PROTEIN: 6 g/dL (ref 6.0–8.3)
Total Bilirubin: 0.3 mg/dL (ref 0.3–1.2)
Total Bilirubin: 0.2 mg/dL — ABNORMAL LOW (ref 0.3–1.2)
Total Protein: 5.6 g/dL — ABNORMAL LOW (ref 6.0–8.3)

## 2013-10-15 LAB — CBC
HCT: 37.5 % (ref 36.0–46.0)
HCT: 35.3 % — ABNORMAL LOW (ref 36.0–46.0)
HEMOGLOBIN: 12.6 g/dL (ref 12.0–15.0)
Hemoglobin: 11.8 g/dL — ABNORMAL LOW (ref 12.0–15.0)
MCH: 31.6 pg (ref 26.0–34.0)
MCH: 31.6 pg (ref 26.0–34.0)
MCHC: 33.6 g/dL (ref 30.0–36.0)
MCHC: 33.4 g/dL (ref 30.0–36.0)
MCV: 94 fL (ref 78.0–100.0)
MCV: 94.6 fL (ref 78.0–100.0)
Platelets: 115 10*3/uL — ABNORMAL LOW (ref 150–400)
Platelets: 124 10*3/uL — ABNORMAL LOW (ref 150–400)
RBC: 3.99 MIL/uL (ref 3.87–5.11)
RBC: 3.73 MIL/uL — ABNORMAL LOW (ref 3.87–5.11)
RDW: 13.4 % (ref 11.5–15.5)
RDW: 13.6 % (ref 11.5–15.5)
WBC: 12.9 10*3/uL — ABNORMAL HIGH (ref 4.0–10.5)
WBC: 12.7 10*3/uL — ABNORMAL HIGH (ref 4.0–10.5)

## 2013-10-15 LAB — MAGNESIUM: Magnesium: 4.4 mg/dL — ABNORMAL HIGH (ref 1.5–2.5)

## 2013-10-15 NOTE — Progress Notes (Signed)
Patient ID: Tasha Price, female   DOB: [birth year], 33 y.o.   MRN: [id number]  S/p LTCS for HELLP/fetal distress  Uncomfortable, but ambulating.  Excellent diuresis today.  Will recheck labs to see if can transfer out.  No PIH sx's,  Stop magnesium  AF VSS  gen NAD Abd app tender  33yo s/p LTCS PPD#1, doing well, recheck labs. Poss transfer to GYN Baby doing well

## 2013-10-15 NOTE — Anesthesia Postprocedure Evaluation (Signed)
Anesthesia Post Note  Patient: Tasha Price  Procedure(s) Performed: Procedure(s) (LRB): CESAREAN SECTION (N/A)  Anesthesia type: Spinal  Patient location: Mother/Baby  Post pain: Pain level controlled  Post assessment: Post-op Vital signs reviewed  Last Vitals:  Filed Vitals:   10/15/13 0811  BP: 128/85  Pulse: 105  Temp: 36.9 C  Resp: 16    Post vital signs: Reviewed  Level of consciousness: awake  Complications: No apparent anesthesia complications

## 2013-10-15 NOTE — Addendum Note (Signed)
Addendum created 10/15/13 0931 by Algis Greenhouse, CRNA   Modules edited: Notes Section   Notes Section:  File: [id number]

## 2013-10-15 NOTE — Progress Notes (Signed)
Subjective: Postpartum Day 1: Cesarean Delivery  HELLP Patient reports tolerating PO.  No PIH sx, epigastric pain improving  Objective: Vital signs in last 24 hours: Temp:  [98.1 F (36.7 C)-98.5 F (36.9 C)] 98.1 F (36.7 C) (09/25 0336) Pulse Rate:  [88-113] 88 (09/25 0600) Resp:  [14-32] 16 (09/25 0420) BP: (123-170)/(63-101) 137/86 mmHg (09/25 0600) SpO2:  [94 %-99 %] 98 % (09/25 0600) Weight:  [98.884 kg (218 lb)-104.327 kg (230 lb)] 98.884 kg (218 lb) (09/25 0600)  Physical Exam:  General: alert and cooperative Lochia: appropriate Uterine Fundus: firm Incision: C/D/I    Recent Labs  10/14/13 1750 10/15/13 0550  HGB 13.8 12.6  HCT 40.3 37.5    Assessment/Plan: Status post Cesarean section. Doing well postoperatively.  BP normalizing Excellent diuresis--1600/shift LFT's minimally improved, platelets down slightly--will follow trend tomorrow  Tasha Price 10/15/2013, 7:21 AM

## 2013-10-15 NOTE — Telephone Encounter (Signed)
Preadmission screen  

## 2013-10-15 NOTE — Lactation Note (Signed)
This note was copied from the chart of Tasha Price. Lactation Consultation Note  Patient Name: Tasha Price Date: 10/15/2013 Reason for consult: Initial assessment  Initial visit from [facility]; infant is 45 hours old.  Mom in AICU for Preeclampsia and is on MgSO4; C-Section; EBL - 600 ml.  Mom is a P1.  Infant has breastfed x5 (10-15 min) +1 attempt (skin-to-skin) + formula x1 (7 ml); voids -2; stools-4; LS-8 by RN.  Mom states the infant does not like going to right side but is breastfeeding from left in cross-cradle hold.  LC offered assistance and mom consented.  LC worked with mom getting infant latched in football hold on right side.  After several attempts infant latched with several sucks but was very slow with eating.  Infant fed for 10 minutes with LS-7.  Discussed with mom it could been a position preference of infant.  Taught hand expression with return demonstration.  Encouraged mom to feed with feeding cues.  Basic education done.  Day of Life Supplementation Guidelines given for formula; mom then stated that she did not give the formula during the night and that nursery gave the formula without her knowledge.  Encouraged mom to continue breastfeeding.  Lactation brochure given and informed of outpatient services and hospital support group.     Maternal Data Formula Feeding for Exclusion: No Has patient been taught Hand Expression?: Yes Does the patient have breastfeeding experience prior to this delivery?: No  Feeding Feeding Type: Breast Fed Length of feed: 10 min  LATCH Score/Interventions Latch: Repeated attempts needed to sustain latch, nipple held in mouth throughout feeding, stimulation needed to elicit sucking reflex. Intervention(s): Adjust position;Assist with latch;Breast compression  Audible Swallowing: A few with stimulation Intervention(s): Hand expression;Skin to skin  Type of Nipple: Everted at rest and after stimulation  Comfort (Breast/Nipple): Soft  / non-tender     Hold (Positioning): Assistance needed to correctly position infant at breast and maintain latch. Intervention(s): Support Pillows;Skin to skin;Breastfeeding basics reviewed  LATCH Score: 7  Lactation Tools Discussed/Used WIC Program: No   Consult Status Consult Status: Follow-up Date: 10/16/13 Follow-up type: In-patient    Lendon Ka 10/15/2013, 5:52 PM

## 2013-10-16 LAB — COMPREHENSIVE METABOLIC PANEL
ALBUMIN: 2.3 g/dL — AB (ref 3.5–5.2)
ALK PHOS: 165 U/L — AB (ref 39–117)
ALT: 102 U/L — ABNORMAL HIGH (ref 0–35)
ANION GAP: 13 (ref 5–15)
AST: 77 U/L — ABNORMAL HIGH (ref 0–37)
BUN: 13 mg/dL (ref 6–23)
CHLORIDE: 103 meq/L (ref 96–112)
CO2: 24 meq/L (ref 19–32)
Calcium: 8.2 mg/dL — ABNORMAL LOW (ref 8.4–10.5)
Creatinine, Ser: 0.75 mg/dL (ref 0.50–1.10)
GFR calc Af Amer: >90 mL/min (ref >90)
Glucose, Bld: 82 mg/dL (ref 70–99)
Potassium: 4.4 mEq/L (ref 3.7–5.3)
Sodium: 140 mEq/L (ref 137–147)
Total Protein: 5.9 g/dL — ABNORMAL LOW (ref 6.0–8.3)

## 2013-10-16 LAB — CBC
HEMATOCRIT: 34.9 % — AB (ref 36.0–46.0)
Hemoglobin: 11.6 g/dL — ABNORMAL LOW (ref 12.0–15.0)
MCH: 31.7 pg (ref 26.0–34.0)
MCHC: 33.2 g/dL (ref 30.0–36.0)
MCV: 95.4 fL (ref 78.0–100.0)
Platelets: 126 10*3/uL — ABNORMAL LOW (ref 150–400)
RBC: 3.66 MIL/uL — AB (ref 3.87–5.11)
RDW: 13.8 % (ref 11.5–15.5)
WBC: 14.5 10*3/uL — AB (ref 4.0–10.5)

## 2013-10-16 NOTE — Lactation Note (Signed)
This note was copied from the chart of Tasha Antonisha Waskey. Lactation Consultation Note  Patient Name: Tasha Price [id number] Date: 10/16/2013 Reason for consult: Follow-up assessment  Infant is 31 hrs old and has been breastfeeding consistently.  Breastfed x9 (10-30 min) + 1 attempts in past 24 hrs; voids-4 in 24 hours / 4 life; stools - 2 in 24 hrs/ 4 life.  LS -7 by RN.  RN noticed jittery behavior from infant, blood sugar - 41.  LC in to observe breastfeeding.  Infant was latched on left breast cradle hold with a very shallow latch.  LC worked with mom to attain depth and taught asymmetrical latching technique with cross-cradle hold.  Mom return demonstrated technique independently with depth and was able to flange bottom lip herself.  Swallows heard but infant fed in slow pattern and needed lots stimulation to keep sucking; however, mom had been feeding infant for 45 minutes prior to [facility] arrival.  LS-8.  Taught mom how to hand express and spoon feed additional colostrum back to infant after breastfeedings.  Few drops spoon fed to infant for demonstration.  Gave mom curved-tip syringe, colostrum collection container, spoons, and hand pump for giving additional calories.  While LC was in and out of room getting supplies, mom independently latched infant to right breast cross-cradle hold with depth and flanged lips.  Encouraged to call for assistance if needed.  Plan communicated with RN.  Maternal Data    Feeding Feeding Type: Breast Fed Length of feed: 45 min (slow lots stim needed per mom)  LATCH Score/Interventions Latch: Grasps breast easily, tongue down, lips flanged, rhythmical sucking. Intervention(s): Breast compression;Assist with latch  Audible Swallowing: A few with stimulation  Type of Nipple: Everted at rest and after stimulation  Comfort (Breast/Nipple): Soft / non-tender     Hold (Positioning): Assistance needed to correctly position infant at breast and maintain  latch. Intervention(s): Breastfeeding basics reviewed;Support Pillows;Skin to skin  LATCH Score: 8  Lactation Tools Discussed/Used Pump Review: Setup, frequency, and cleaning;Milk Storage Initiated by:: LC Date initiated:: 10/16/13   Consult Status Consult Status: Follow-up Date: 10/17/13 Follow-up type: In-patient    Lendon Ka 10/16/2013, 3:06 PM

## 2013-10-16 NOTE — Progress Notes (Signed)
Subjective: Postpartum Day 2: Cesarean Delivery/ HELLP Patient reports incisional pain and tolerating PO.  Nl lochia, pain controlled  Objective: Vital signs in last 24 hours: Temp:  [97.9 F (36.6 C)-99 F (37.2 C)] 97.9 F (36.6 C) (09/26 0429) Pulse Rate:  [88-111] 91 (09/26 0429) Resp:  [18-20] 18 (09/26 0429) BP: (121-146)/(71-86) 129/71 mmHg (09/26 0429) SpO2:  [96 %-98 %] 97 % (09/25 1900)  Physical Exam:  General: alert and no distress Lochia: appropriate Uterine Fundus: firm   Recent Labs  10/15/13 1840 10/16/13 0641  HGB 11.8* 11.6*  HCT 35.3* 34.9*    Assessment/Plan: Status post Cesarean section. Doing well postoperatively.  Continue current care.  Plts improving, LFTs are improving  Bovard-Stuckert, Lexee Brashears 10/16/2013, 8:43 AM

## 2013-10-17 MED ORDER — OXYCODONE-ACETAMINOPHEN 5-325 MG PO TABS: 1.0000 | ORAL_TABLET | Freq: Four times a day (QID) | ORAL | Status: DC | PRN

## 2013-10-17 MED ORDER — IBUPROFEN 800 MG PO TABS: 800.0000 mg | ORAL_TABLET | Freq: Three times a day (TID) | ORAL | Status: DC | PRN

## 2013-10-17 MED ORDER — PRENATAL MULTIVITAMIN CH: 1.0000 | ORAL_TABLET | Freq: Every day | ORAL | Status: DC

## 2013-10-17 NOTE — Lactation Note (Addendum)
This note was copied from the chart of Tasha Price. Lactation Consultation Note  Patient Name: Tasha Jovani Colquhoun [id number] Date: 10/17/2013 Reason for consult: Follow-up assessment;Late preterm infant Per mom the baby had a circ this am and has been sleepy , even when I tried feeding awhile ago. LC reviewed Late preterm behavior and the need for extra post  pumping. Per mom has a DEBP at home, Medela. LC reviewed sore nipple and engorgement prevention and tx if needed. Per mom nipples are fine and so far not sore.  Breast are feeling fuller and heavier. LC also referenced the Baby and Me Booklet pages 24 and 25. Mother informed of post-discharge support and given phone number to the lactation department, including services for phone  call assistance; out-patient appointments; and breastfeeding support group. List of other breastfeeding resources in the community  given in the handout. Encouraged mother to call for problems or concerns related to breastfeeding.     Maternal Data    Feeding Feeding Type:  (per mom recently tried ) Length of feed:  (per mom a few sucks )  LATCH Score/Interventions                Intervention(s): Breastfeeding basics reviewed (see LC note )     Lactation Tools Discussed/Used     Consult Status Consult Status: Complete Date: 10/17/13    Kathrin Greathouse 10/17/2013, 11:10 AM

## 2013-10-17 NOTE — Discharge Summary (Signed)
Obstetric Discharge Summary Reason for Admission: induction of labor Prenatal Procedures: Preeclampsia  Intrapartum Procedures: cesarean: low cervical, transverse Postpartum Procedures: none Complications-Operative and Postpartum: none Hemoglobin  Date Value Ref Range Status  10/16/2013 11.6* 12.0 - 15.0 g/dL Final     HCT  Date Value Ref Range Status  10/16/2013 34.9* 36.0 - 46.0 % Final    Physical Exam:  General: alert Lochia: appropriate Uterine Fundus: firm Incision: healing well DVT Evaluation: No evidence of DVT seen on physical exam.  Discharge Diagnoses: Term Pregnancy-delivered, HELLP  Discharge Information: Date: 10/17/2013 Activity: pelvic rest Diet: routine Medications: PNV, Ibuprofen and Percocet Condition: stable Instructions: refer to practice specific booklet Discharge to: home Follow-up Information   Follow up with Oliver Pila, MD In 2 weeks. (for incision check, 6wks for postpartum check!)    Specialty:  Obstetrics and Gynecology   Contact information:   [address] [place] [address] [phone]       Newborn Data: Live born female  Birth Weight: 6 lb 15.1 oz (3150 g) APGAR: 8, 9  Home with mother.  Tasha Price, Tasha Price 10/17/2013, 8:56 AM

## 2013-10-17 NOTE — Progress Notes (Signed)
Subjective: Postpartum Day 3: Cesarean Delivery Patient reports incisional pain and tolerating PO.  Pain controlled, nl lochia.  Teary and overwhelmed.  D/w pt baby blues, and PPD  Objective: Vital signs in last 24 hours: Temp:  [97.8 F (36.6 C)-98.8 F (37.1 C)] 98.1 F (36.7 C) (09/27 0700) Pulse Rate:  [81-96] 81 (09/27 0830) Resp:  [18-20] 20 (09/27 0700) BP: (129-151)/(74-102) 133/74 mmHg (09/27 0830)  Physical Exam:  General: alert and no distress Lochia: appropriate Uterine Fundus: firm Incision: C/D/I   Recent Labs  10/15/13 1840 10/16/13 0641  HGB 11.8* 11.6*  HCT 35.3* 34.9*    Assessment/Plan: Status post Cesarean section. Doing well postoperatively.  Discharge home with standard precautions and return to clinic in 2 weeks. Pt requests d/c to home.  Reinforced needs support and sleep.  #'s to call.    Bovard-Stuckert, Emelie Newsom 10/17/2013, 8:39 AM

## 2013-10-20 ENCOUNTER — Inpatient Hospital Stay (HOSPITAL_COMMUNITY): Admission: RE | Admit: 2013-10-20 | Payer: 59 | Source: Ambulatory Visit

## 2013-11-22 ENCOUNTER — Encounter (HOSPITAL_COMMUNITY): Payer: Self-pay | Admitting: Obstetrics and Gynecology

## 2017-01-31 DIAGNOSIS — Z124 Encounter for screening for malignant neoplasm of cervix: Secondary | ICD-10-CM | POA: Diagnosis not present

## 2017-01-31 DIAGNOSIS — Z01419 Encounter for gynecological examination (general) (routine) without abnormal findings: Secondary | ICD-10-CM | POA: Diagnosis not present

## 2017-01-31 DIAGNOSIS — Z6824 Body mass index (BMI) 24.0-24.9, adult: Secondary | ICD-10-CM | POA: Diagnosis not present

## 2017-07-22 DIAGNOSIS — Z0000 Encounter for general adult medical examination without abnormal findings: Secondary | ICD-10-CM | POA: Diagnosis not present

## 2017-07-22 DIAGNOSIS — Z13220 Encounter for screening for lipoid disorders: Secondary | ICD-10-CM | POA: Diagnosis not present

## 2018-02-13 DIAGNOSIS — Z1389 Encounter for screening for other disorder: Secondary | ICD-10-CM | POA: Diagnosis not present

## 2018-02-13 DIAGNOSIS — Z01419 Encounter for gynecological examination (general) (routine) without abnormal findings: Secondary | ICD-10-CM | POA: Diagnosis not present

## 2018-02-13 DIAGNOSIS — Z6824 Body mass index (BMI) 24.0-24.9, adult: Secondary | ICD-10-CM | POA: Diagnosis not present

## 2020-04-15 LAB — HM PAP SMEAR: HPV Aptima: NEGATIVE

## 2022-11-22 ENCOUNTER — Other Ambulatory Visit: Payer: Self-pay

## 2023-01-01 ENCOUNTER — Other Ambulatory Visit (HOSPITAL_BASED_OUTPATIENT_CLINIC_OR_DEPARTMENT_OTHER): Payer: Self-pay

## 2023-01-02 ENCOUNTER — Other Ambulatory Visit (HOSPITAL_BASED_OUTPATIENT_CLINIC_OR_DEPARTMENT_OTHER): Payer: Self-pay

## 2023-01-03 ENCOUNTER — Other Ambulatory Visit: Payer: Self-pay

## 2023-01-03 ENCOUNTER — Other Ambulatory Visit (HOSPITAL_BASED_OUTPATIENT_CLINIC_OR_DEPARTMENT_OTHER): Payer: Self-pay

## 2023-01-06 ENCOUNTER — Other Ambulatory Visit (HOSPITAL_BASED_OUTPATIENT_CLINIC_OR_DEPARTMENT_OTHER): Payer: Self-pay

## 2023-01-09 ENCOUNTER — Other Ambulatory Visit (HOSPITAL_BASED_OUTPATIENT_CLINIC_OR_DEPARTMENT_OTHER): Payer: Self-pay

## 2023-01-23 DIAGNOSIS — Z0000 Encounter for general adult medical examination without abnormal findings: Secondary | ICD-10-CM | POA: Diagnosis not present

## 2023-01-23 DIAGNOSIS — Z23 Encounter for immunization: Secondary | ICD-10-CM | POA: Diagnosis not present

## 2023-01-23 DIAGNOSIS — Z13220 Encounter for screening for lipoid disorders: Secondary | ICD-10-CM | POA: Diagnosis not present

## 2023-06-20 ENCOUNTER — Other Ambulatory Visit (HOSPITAL_BASED_OUTPATIENT_CLINIC_OR_DEPARTMENT_OTHER): Payer: Self-pay

## 2023-07-21 DIAGNOSIS — Z130 Encounter for screening for diseases of the blood and blood-forming organs and certain disorders involving the immune mechanism: Secondary | ICD-10-CM | POA: Diagnosis not present

## 2023-07-21 DIAGNOSIS — Z01419 Encounter for gynecological examination (general) (routine) without abnormal findings: Secondary | ICD-10-CM | POA: Diagnosis not present

## 2023-07-21 DIAGNOSIS — Z1389 Encounter for screening for other disorder: Secondary | ICD-10-CM | POA: Diagnosis not present

## 2023-07-21 DIAGNOSIS — Z1231 Encounter for screening mammogram for malignant neoplasm of breast: Secondary | ICD-10-CM | POA: Diagnosis not present

## 2023-07-21 LAB — HM MAMMOGRAPHY

## 2023-09-19 ENCOUNTER — Other Ambulatory Visit (HOSPITAL_BASED_OUTPATIENT_CLINIC_OR_DEPARTMENT_OTHER): Payer: Self-pay

## 2023-10-06 ENCOUNTER — Other Ambulatory Visit (HOSPITAL_BASED_OUTPATIENT_CLINIC_OR_DEPARTMENT_OTHER): Payer: Self-pay

## 2023-10-06 MED ORDER — FLUZONE 0.5 ML IM SUSY
0.5000 mL | PREFILLED_SYRINGE | Freq: Once | INTRAMUSCULAR | 0 refills | Status: AC
  Filled 2023-10-06: qty 0.5, 1d supply, fill #0

## 2023-11-10 ENCOUNTER — Other Ambulatory Visit (HOSPITAL_BASED_OUTPATIENT_CLINIC_OR_DEPARTMENT_OTHER): Payer: Self-pay

## 2024-01-26 ENCOUNTER — Other Ambulatory Visit (HOSPITAL_BASED_OUTPATIENT_CLINIC_OR_DEPARTMENT_OTHER): Payer: Self-pay

## 2024-01-30 ENCOUNTER — Ambulatory Visit: Admitting: Student

## 2024-01-30 ENCOUNTER — Encounter: Payer: Self-pay | Admitting: Student

## 2024-01-30 ENCOUNTER — Other Ambulatory Visit (HOSPITAL_BASED_OUTPATIENT_CLINIC_OR_DEPARTMENT_OTHER): Payer: Self-pay

## 2024-01-30 VITALS — BP 108/80 | HR 82 | Temp 98.0°F | Resp 16 | Ht 69.0 in | Wt 164.2 lb

## 2024-01-30 DIAGNOSIS — D509 Iron deficiency anemia, unspecified: Secondary | ICD-10-CM | POA: Diagnosis not present

## 2024-01-30 DIAGNOSIS — G4709 Other insomnia: Secondary | ICD-10-CM | POA: Diagnosis not present

## 2024-01-30 DIAGNOSIS — N951 Menopausal and female climacteric states: Secondary | ICD-10-CM | POA: Insufficient documentation

## 2024-01-30 DIAGNOSIS — F419 Anxiety disorder, unspecified: Secondary | ICD-10-CM | POA: Diagnosis not present

## 2024-01-30 DIAGNOSIS — Z7689 Persons encountering health services in other specified circumstances: Secondary | ICD-10-CM | POA: Insufficient documentation

## 2024-01-30 MED ORDER — TRAZODONE HCL 50 MG PO TABS
25.0000 mg | ORAL_TABLET | Freq: Every evening | ORAL | 3 refills | Status: AC | PRN
  Filled 2024-01-30: qty 30, 30d supply, fill #0

## 2024-01-30 NOTE — Assessment & Plan Note (Signed)
"  Stable   Asymptomatic   "

## 2024-01-30 NOTE — Assessment & Plan Note (Addendum)
 Symptoms include irritability, sweating, and memory issues. Current use of Mirena IUD.   Discuss potential  hormone replacement therapy with gynecology, considering HELLP syndrome during pregnancy r/y hypercoagulability.

## 2024-01-30 NOTE — Assessment & Plan Note (Signed)
" °  Medsication - Rx- trazodone  25 mg HS, may increase to 50 mg if needed prn   Encouraged good sleep hygiene such as dark, quiet room. No blue/green glowing lights such as computer screens in bedroom. No alcohol or stimulants in evening. Cut down on caffeine as able. Regular exercise is helpful but not just prior to bed time.       "

## 2024-01-30 NOTE — Progress Notes (Signed)
 Chief Complaint  Patient presents with   New Patient (Initial Visit)    Patient is here to establish care with the provider       New Patient Visit SUBJECTIVE: HPI: Tasha Price is an 44 y.o.female who is being seen for establishing care.  She has Hx iron deficiency anemia, denies current symptoms. She has a Mirena IUD with amenorrhea and perimenopausal symptoms including irritability, sweating, word-finding difficulty, and significant insomnia with multiple nocturnal awakenings and night sweats despite 12 mg nightly melatonin. She typically sleeps from 10 PM to 5 AM due to work and family responsibilities.  Her past medical history includes HELLP syndrome during pregnancy requiring C-section at 37.5 weeks. Family history includes a great aunt with breast cancer and a mother with high cholesterol. She drinks alcohol sparingly, is a former smoker, works full-time in scientist, research (physical sciences), and is married with one 19 year old son. Reports last cervical cancer screening was about two years ago and her last mammogram was in June of last year.  [facility]-  Follows with GYN- Nathanel Bunker, [facility]  Patient denies fever, chills, SOB, CP, palpitations, dyspnea, edema, HA, vision changes, N/V/D, abdominal pain, urinary symptoms, rash, weight changes, and recent illness or hospitalizations.     Past Medical History:  Diagnosis Date   IDA (iron deficiency anemia)    Past Surgical History:  Procedure Laterality Date   CESAREAN SECTION N/A 10/14/2013   Procedure: CESAREAN SECTION;  Surgeon: Nathanel LELON Bunker, MD;  Location: [facility];  Service: Obstetrics;  Laterality: N/A;   DILATION AND CURETTAGE OF UTERUS     LEEP  2003   TONSILLECTOMY     Family History  Problem Relation Age of Onset   Hyperlipidemia Mother    Kidney disease Maternal Grandmother    Liver disease Maternal Grandfather    Allergies[1] Current Medications[2]  PHQ9 Today:    01/30/2024    8:29 AM   Depression screen PHQ 2/9  Decreased Interest 0  Down, Depressed, Hopeless 0  PHQ - 2 Score 0  Altered sleeping 3  Tired, decreased energy 3  Change in appetite 0  Feeling bad or failure about yourself  0  Trouble concentrating 0  Moving slowly or fidgety/restless 0  Suicidal thoughts 0  PHQ-9 Score 6  Difficult doing work/chores Not difficult at all   GAD7 Today:    01/30/2024    8:29 AM  GAD 7 : Generalized Anxiety Score  Nervous, Anxious, on Edge 2  Control/stop worrying 3  Worry too much - different things 2  Trouble relaxing 3  Restless 1  Easily annoyed or irritable 3  Afraid - awful might happen 0  Total GAD 7 Score 14  Anxiety Difficulty Somewhat difficult    OBJECTIVE: BP 108/80 (BP Location: Left Arm, Patient Position: Sitting, Cuff Size: Normal)   Pulse 82   Temp 98 F (36.7 C) (Oral)   Resp 16   Ht 5' 9 (1.753 m)   Wt 164 lb 3.2 oz (74.5 kg)   SpO2 97%   BMI 24.25 kg/m  General:  well developed, well nourished, in no apparent distress Nose:  nares patent, septum midline, mucosa normal Throat/Pharynx:  lips and gingiva without lesion; tongue and uvula midline; non-inflamed pharynx; no exudates or postnasal drainage Lungs:  clear to auscultation, breath sounds equal bilaterally, no respiratory distress Cardio:  regular rate and rhythm, no LE edema or bruits Musculoskeletal:  symmetrical muscle groups noted without atrophy or deformity Neuro:  gait normal Psych: well oriented with normal range of affect and appropriate judgment/insight  ASSESSMENT/PLAN: Encounter to establish care  Iron deficiency anemia, unspecified iron deficiency anemia type  Other insomnia - Plan: traZODone  (DESYREL ) 50 MG tablet  Perimenopausal symptoms  Anxiety  Insomnia Medication - Rx- trazodone  25 mg HS, may increase to 50 mg if needed prn    Encouraged good sleep hygiene such as dark, quiet room. No blue/green glowing lights such as computer screens in bedroom. No  alcohol or stimulants in evening. Cut down on caffeine as able. Regular exercise is helpful but not just prior to bed time.      Perimenopausal symptoms Symptoms include irritability, sweating. Current use of Mirena IUD.   Discuss potential  hormone replacement therapy with gynecology, considering HELLP syndrome during pregnancy r/y hypercoagulability.   IDA Stable,asymptomatic  Patient instructed to sign release of records form from their previous PCP. The patient voiced understanding and agreement to the plan. Education provided today during visit and on AVS for patient to review at home.  Diet and Exercise recommendations provided.  Current diagnoses and recommendations discussed. HM recommendations reviewed with recommendations.    Patient should return 3 months CPE Return in about 3 months (around 04/29/2024), or CPE.  FU 3 months CPE   Harlene LITTIE Jolly, DNP, AGNP-C 01/30/2024  8:57 AM     [1] No Known Allergies [2]  Current Outpatient Medications:    levonorgestrel (MIRENA, 52 MG,) 20 MCG/DAY IUD, Take 1 device by intrauterine route., Disp: , Rfl:    Melatonin 10 MG TABS, 10 mg as needed by oral route., Disp: , Rfl:    traZODone  (DESYREL ) 50 MG tablet, Take 0.5-1 tablets (25-50 mg total) by mouth at bedtime as needed for sleep., Disp: 30 tablet, Rfl: 3

## 2024-01-30 NOTE — Assessment & Plan Note (Signed)
" °    01/30/2024    8:29 AM  GAD 7 : Generalized Anxiety Score  Nervous, Anxious, on Edge 2  Control/stop worrying 3  Worry too much - different things 2  Trouble relaxing 3  Restless 1  Easily annoyed or irritable 3  Afraid - awful might happen 0  Total GAD 7 Score 14  Anxiety Difficulty Somewhat difficult   Rx Trazodone  Started HS, will reassess sleep and mood at FU.   "

## 2024-01-30 NOTE — Progress Notes (Deleted)
 "  Subjective:     Patient ID: Tasha Price, female    DOB: [birth year], 44 y.o.   MRN: [id number]  No chief complaint on file.   HPI  Discussed the use of AI scribe software for clinical note transcription with the patient, who gave verbal consent to proceed.  History of Present Illness              Health Maintenance Due  Topic Date Due   Hepatitis C Screening  Never done   DTaP/Tdap/Td (1 - Tdap) Never done   Hepatitis B Vaccines 19-59 Average Risk (1 of 3 - 19+ 3-dose series) Never done   HPV VACCINES (1 - 3-dose SCDM series) Never done   Cervical Cancer Screening (HPV/Pap Cotest)  Never done   Mammogram  Never done   COVID-19 Vaccine (4 - 2025-26 season) 09/22/2023    No past medical history on file.  Past Surgical History:  Procedure Laterality Date   CESAREAN SECTION N/A 10/14/2013   Procedure: CESAREAN SECTION;  Surgeon: Nathanel LELON Bunker, MD;  Location: [facility];  Service: Obstetrics;  Laterality: N/A;   DILATION AND CURETTAGE OF UTERUS     LEEP  2003   TONSILLECTOMY      No family history on file.  Social History   Socioeconomic History   Marital status: Married    Spouse name: Not on file   Number of children: Not on file   Years of education: Not on file   Highest education level: Some college, no degree  Occupational History   Not on file  Tobacco Use   Smoking status: Former    Current packs/day: 0.00    Types: Cigarettes    Quit date: 10/15/2010    Years since quitting: 13.3   Smokeless tobacco: Never  Substance and Sexual Activity   Alcohol use: No   Drug use: No   Sexual activity: Not on file  Other Topics Concern   Not on file  Social History Narrative   Not on file   Social Drivers of Health   Tobacco Use: Medium Risk (01/23/2024)   Patient History    Smoking Tobacco Use: Former    Smokeless Tobacco Use: Never    Passive Exposure: Not on file  Financial Resource Strain: Low Risk (01/23/2024)   Overall Financial Resource Strain  (CARDIA)    Difficulty of Paying Living Expenses: Not very hard  Food Insecurity: No Food Insecurity (01/23/2024)   Epic    Worried About Radiation Protection Practitioner of Food in the Last Year: Never true    Ran Out of Food in the Last Year: Never true  Transportation Needs: No Transportation Needs (01/23/2024)   Epic    Lack of Transportation (Medical): No    Lack of Transportation (Non-Medical): No  Physical Activity: Sufficiently Active (01/23/2024)   Exercise Vital Sign    Days of Exercise per Week: 5 days    Minutes of Exercise per Session: 60 min  Stress: No Stress Concern Present (01/23/2024)   [employer] of Occupational Health - Occupational Stress Questionnaire    Feeling of Stress: Only a little  Social Connections: Unknown (01/23/2024)   Social Connection and Isolation Panel    Frequency of Communication with Friends and Family: Patient declined    Frequency of Social Gatherings with Friends and Family: Once a week    Attends Religious Services: Never    Database Administrator or Organizations: No    Attends Banker Meetings: Not on  file    Marital Status: Married  Catering Manager Violence: Not on file  Depression (EYV7-0): Not on file  Alcohol Screen: Low Risk (01/23/2024)   Alcohol Screen    Last Alcohol Screening Score (AUDIT): 1  Housing: Low Risk (01/23/2024)   Epic    Unable to Pay for Housing in the Last Year: No    Number of Times Moved in the Last Year: 0    Homeless in the Last Year: No  Utilities: Not on file  Health Literacy: Not on file    Outpatient Medications Prior to Visit  Medication Sig Dispense Refill   calcium carbonate (TUMS - DOSED IN MG ELEMENTAL CALCIUM) 500 MG chewable tablet Chew 1 tablet by mouth 3 (three) times daily as needed for indigestion or heartburn.     Ferrous Sulfate (SLOW FE PO) Take 1 tablet by mouth daily.     ibuprofen  (ADVIL ,MOTRIN ) 800 MG tablet Take 1 tablet (800 mg total) by mouth every 8 (eight) hours as needed. 45 tablet 1    oxyCODONE -acetaminophen  (PERCOCET/ROXICET) 5-325 MG per tablet Take 1-2 tablets by mouth every 6 (six) hours as needed (for pain scale less than 7). 40 tablet 0   Prenatal Vit-Fe Fumarate-FA (PRENATAL MULTIVITAMIN) TABS tablet Take 1 tablet by mouth daily at 12 noon. 30 tablet 12   No facility-administered medications prior to visit.    Allergies[1]  ROS     Objective:    Physical Exam   There were no vitals taken for this visit. Wt Readings from Last 3 Encounters:  10/15/13 218 lb (98.9 kg)       Assessment & Plan:   Problem List Items Addressed This Visit   None   I am having [name] maintain her calcium carbonate, Ferrous Sulfate (SLOW FE PO), ibuprofen , oxyCODONE -acetaminophen , and prenatal multivitamin.  No orders of the defined types were placed in this encounter.     [1] No Known Allergies  "

## 2024-04-29 ENCOUNTER — Ambulatory Visit: Admitting: Student
